# Patient Record
Sex: Female | Born: 1960
Health system: Southern US, Community
[De-identification: ages and names within clinical notes are randomized; demographics above are authoritative.]

---

## 1998-09-18 ENCOUNTER — Ambulatory Visit (HOSPITAL_BASED_OUTPATIENT_CLINIC_OR_DEPARTMENT_OTHER): Admission: RE | Admit: 1998-09-18 | Discharge: 1998-09-18 | Payer: Self-pay | Admitting: Otolaryngology

## 1999-04-02 ENCOUNTER — Other Ambulatory Visit: Admission: RE | Admit: 1999-04-02 | Discharge: 1999-04-02 | Payer: Self-pay | Admitting: Obstetrics and Gynecology

## 1999-07-19 ENCOUNTER — Inpatient Hospital Stay (HOSPITAL_COMMUNITY): Admission: RE | Admit: 1999-07-19 | Discharge: 1999-07-22 | Payer: Self-pay | Admitting: Obstetrics and Gynecology

## 2000-02-21 ENCOUNTER — Other Ambulatory Visit: Admission: RE | Admit: 2000-02-21 | Discharge: 2000-02-21 | Payer: Self-pay | Admitting: Obstetrics and Gynecology

## 2001-06-05 ENCOUNTER — Other Ambulatory Visit: Admission: RE | Admit: 2001-06-05 | Discharge: 2001-06-05 | Payer: Self-pay | Admitting: Obstetrics and Gynecology

## 2002-08-01 ENCOUNTER — Ambulatory Visit (HOSPITAL_COMMUNITY): Admission: RE | Admit: 2002-08-01 | Discharge: 2002-08-01 | Payer: Self-pay | Admitting: Neurosurgery

## 2002-08-01 ENCOUNTER — Encounter: Payer: Self-pay | Admitting: Neurosurgery

## 2003-08-13 ENCOUNTER — Other Ambulatory Visit: Admission: RE | Admit: 2003-08-13 | Discharge: 2003-08-13 | Payer: Self-pay | Admitting: Obstetrics and Gynecology

## 2005-10-25 ENCOUNTER — Encounter: Admission: RE | Admit: 2005-10-25 | Discharge: 2005-10-25 | Payer: Self-pay | Admitting: Family Medicine

## 2005-11-25 ENCOUNTER — Encounter: Admission: RE | Admit: 2005-11-25 | Discharge: 2005-11-25 | Payer: Self-pay | Admitting: Family Medicine

## 2011-11-14 ENCOUNTER — Other Ambulatory Visit: Payer: Self-pay | Admitting: Neurosurgery

## 2011-11-14 DIAGNOSIS — M5020 Other cervical disc displacement, unspecified cervical region: Secondary | ICD-10-CM

## 2011-11-15 ENCOUNTER — Ambulatory Visit
Admission: RE | Admit: 2011-11-15 | Discharge: 2011-11-15 | Disposition: A | Discharge status: Home / Self Care | Payer: Medicare Other | Source: Ambulatory Visit | Attending: Neurosurgery | Admitting: Neurosurgery

## 2011-11-15 ENCOUNTER — Inpatient Hospital Stay: Admission: RE | Admit: 2011-11-15 | Payer: Self-pay | Source: Ambulatory Visit

## 2011-11-15 DIAGNOSIS — M5020 Other cervical disc displacement, unspecified cervical region: Secondary | ICD-10-CM

## 2011-11-15 NOTE — Discharge Instructions (Signed)

## 2014-07-17 ENCOUNTER — Other Ambulatory Visit: Payer: Self-pay | Admitting: Family Medicine

## 2014-07-17 ENCOUNTER — Ambulatory Visit
Admission: RE | Admit: 2014-07-17 | Discharge: 2014-07-17 | Disposition: A | Discharge status: Home / Self Care | Payer: Medicare Other | Source: Ambulatory Visit | Attending: Family Medicine | Admitting: Family Medicine

## 2014-07-17 DIAGNOSIS — M542 Cervicalgia: Secondary | ICD-10-CM

## 2014-07-23 ENCOUNTER — Other Ambulatory Visit: Payer: Self-pay | Admitting: Family Medicine

## 2014-07-23 DIAGNOSIS — M542 Cervicalgia: Secondary | ICD-10-CM

## 2014-07-24 ENCOUNTER — Ambulatory Visit
Admission: RE | Admit: 2014-07-24 | Discharge: 2014-07-24 | Disposition: A | Discharge status: Home / Self Care | Payer: Medicare Other | Source: Ambulatory Visit | Attending: Family Medicine | Admitting: Family Medicine

## 2014-07-24 DIAGNOSIS — M542 Cervicalgia: Secondary | ICD-10-CM

## 2014-07-31 ENCOUNTER — Other Ambulatory Visit: Payer: Medicare Other

## 2014-08-18 DIAGNOSIS — H9190 Unspecified hearing loss, unspecified ear: Secondary | ICD-10-CM | POA: Insufficient documentation

## 2014-08-18 DIAGNOSIS — M5030 Other cervical disc degeneration, unspecified cervical region: Secondary | ICD-10-CM | POA: Insufficient documentation

## 2014-08-18 DIAGNOSIS — M4802 Spinal stenosis, cervical region: Secondary | ICD-10-CM | POA: Insufficient documentation

## 2014-08-18 DIAGNOSIS — E119 Type 2 diabetes mellitus without complications: Secondary | ICD-10-CM | POA: Insufficient documentation

## 2014-08-18 DIAGNOSIS — I10 Essential (primary) hypertension: Secondary | ICD-10-CM | POA: Insufficient documentation

## 2014-08-18 DIAGNOSIS — Z720 Tobacco use: Secondary | ICD-10-CM | POA: Insufficient documentation

## 2014-08-18 DIAGNOSIS — K589 Irritable bowel syndrome without diarrhea: Secondary | ICD-10-CM | POA: Insufficient documentation

## 2014-09-22 ENCOUNTER — Other Ambulatory Visit: Payer: Self-pay | Admitting: Neurosurgery

## 2014-09-22 DIAGNOSIS — M542 Cervicalgia: Secondary | ICD-10-CM

## 2014-09-30 ENCOUNTER — Ambulatory Visit
Admission: RE | Admit: 2014-09-30 | Discharge: 2014-09-30 | Disposition: A | Discharge status: Home / Self Care | Payer: Medicare Other | Source: Ambulatory Visit | Attending: Neurosurgery | Admitting: Neurosurgery

## 2014-09-30 VITALS — BP 146/90 | HR 71

## 2014-09-30 DIAGNOSIS — M5030 Other cervical disc degeneration, unspecified cervical region: Secondary | ICD-10-CM

## 2014-09-30 DIAGNOSIS — M542 Cervicalgia: Secondary | ICD-10-CM

## 2014-09-30 MED ORDER — IOHEXOL 300 MG/ML  SOLN
1.0000 mL | Freq: Once | INTRAMUSCULAR | Status: AC | PRN
  Administered 2014-09-30: 1 mL via EPIDURAL

## 2014-09-30 MED ORDER — TRIAMCINOLONE ACETONIDE 40 MG/ML IJ SUSP (RADIOLOGY)
60.0000 mg | Freq: Once | INTRAMUSCULAR | Status: AC
  Administered 2014-09-30: 60 mg via EPIDURAL

## 2014-09-30 NOTE — Discharge Instructions (Signed)

## 2014-12-04 ENCOUNTER — Other Ambulatory Visit: Payer: Self-pay | Admitting: Neurosurgery

## 2014-12-04 DIAGNOSIS — M47812 Spondylosis without myelopathy or radiculopathy, cervical region: Secondary | ICD-10-CM

## 2014-12-04 DIAGNOSIS — M545 Low back pain: Secondary | ICD-10-CM

## 2014-12-15 ENCOUNTER — Ambulatory Visit
Admission: RE | Admit: 2014-12-15 | Discharge: 2014-12-15 | Disposition: A | Discharge status: Home / Self Care | Payer: Medicare Other | Source: Ambulatory Visit | Attending: Neurosurgery | Admitting: Neurosurgery

## 2014-12-15 ENCOUNTER — Other Ambulatory Visit: Payer: Self-pay

## 2014-12-15 DIAGNOSIS — M4802 Spinal stenosis, cervical region: Secondary | ICD-10-CM

## 2014-12-15 DIAGNOSIS — M47812 Spondylosis without myelopathy or radiculopathy, cervical region: Secondary | ICD-10-CM

## 2014-12-15 MED ORDER — TRIAMCINOLONE ACETONIDE 40 MG/ML IJ SUSP (RADIOLOGY)
60.0000 mg | Freq: Once | INTRAMUSCULAR | Status: AC
  Administered 2014-12-15: 60 mg via EPIDURAL

## 2014-12-15 MED ORDER — IOHEXOL 300 MG/ML  SOLN
1.0000 mL | Freq: Once | INTRAMUSCULAR | Status: AC | PRN
  Administered 2014-12-15: 1 mL via INTRAVENOUS

## 2014-12-23 ENCOUNTER — Other Ambulatory Visit: Payer: Medicare Other

## 2015-09-07 DIAGNOSIS — R918 Other nonspecific abnormal finding of lung field: Secondary | ICD-10-CM | POA: Insufficient documentation

## 2015-12-05 DIAGNOSIS — F419 Anxiety disorder, unspecified: Secondary | ICD-10-CM | POA: Insufficient documentation

## 2015-12-24 ENCOUNTER — Ambulatory Visit
Admission: RE | Admit: 2015-12-24 | Discharge: 2015-12-24 | Disposition: A | Discharge status: Home / Self Care | Payer: Medicare Other | Source: Ambulatory Visit | Attending: Neurosurgery | Admitting: Neurosurgery

## 2015-12-24 ENCOUNTER — Other Ambulatory Visit: Payer: Self-pay | Admitting: Neurosurgery

## 2015-12-24 DIAGNOSIS — M47812 Spondylosis without myelopathy or radiculopathy, cervical region: Secondary | ICD-10-CM

## 2015-12-24 DIAGNOSIS — M545 Low back pain: Secondary | ICD-10-CM

## 2016-01-18 ENCOUNTER — Other Ambulatory Visit: Payer: Self-pay | Admitting: Neurosurgery

## 2016-01-18 DIAGNOSIS — M5090 Cervical disc disorder, unspecified, unspecified cervical region: Secondary | ICD-10-CM

## 2016-01-19 ENCOUNTER — Ambulatory Visit
Admission: RE | Admit: 2016-01-19 | Discharge: 2016-01-19 | Disposition: A | Discharge status: Home / Self Care | Payer: Medicare Other | Source: Ambulatory Visit | Attending: Neurosurgery | Admitting: Neurosurgery

## 2016-01-19 DIAGNOSIS — M5090 Cervical disc disorder, unspecified, unspecified cervical region: Secondary | ICD-10-CM

## 2016-01-29 ENCOUNTER — Other Ambulatory Visit: Payer: Self-pay | Admitting: Neurosurgery

## 2016-01-29 DIAGNOSIS — M5090 Cervical disc disorder, unspecified, unspecified cervical region: Secondary | ICD-10-CM

## 2016-02-03 ENCOUNTER — Ambulatory Visit
Admission: RE | Admit: 2016-02-03 | Discharge: 2016-02-03 | Disposition: A | Discharge status: Home / Self Care | Payer: Medicare Other | Source: Ambulatory Visit | Attending: Neurosurgery | Admitting: Neurosurgery

## 2016-02-03 DIAGNOSIS — R519 Headache, unspecified: Secondary | ICD-10-CM | POA: Insufficient documentation

## 2016-02-03 DIAGNOSIS — M5090 Cervical disc disorder, unspecified, unspecified cervical region: Secondary | ICD-10-CM

## 2016-02-03 DIAGNOSIS — R51 Headache: Secondary | ICD-10-CM

## 2016-02-03 MED ORDER — TRIAMCINOLONE ACETONIDE 40 MG/ML IJ SUSP (RADIOLOGY)
60.0000 mg | Freq: Once | INTRAMUSCULAR | Status: AC
  Administered 2016-02-03: 60 mg via EPIDURAL

## 2016-02-03 MED ORDER — IOPAMIDOL (ISOVUE-300) INJECTION 61%
1.0000 mL | Freq: Once | INTRAVENOUS | Status: AC | PRN
  Administered 2016-02-03: 1 mL

## 2016-02-03 NOTE — Discharge Instructions (Signed)

## 2016-07-04 ENCOUNTER — Encounter (INDEPENDENT_AMBULATORY_CARE_PROVIDER_SITE_OTHER): Payer: Medicare Other | Admitting: Ophthalmology

## 2016-10-17 ENCOUNTER — Other Ambulatory Visit: Payer: Self-pay | Admitting: Nurse Practitioner

## 2016-10-17 DIAGNOSIS — M5090 Cervical disc disorder, unspecified, unspecified cervical region: Secondary | ICD-10-CM

## 2016-11-08 ENCOUNTER — Ambulatory Visit
Admission: RE | Admit: 2016-11-08 | Discharge: 2016-11-08 | Disposition: A | Discharge status: Home / Self Care | Payer: Medicare Other | Source: Ambulatory Visit | Attending: Nurse Practitioner | Admitting: Nurse Practitioner

## 2016-11-08 DIAGNOSIS — M5090 Cervical disc disorder, unspecified, unspecified cervical region: Secondary | ICD-10-CM

## 2016-11-08 MED ORDER — TRIAMCINOLONE ACETONIDE 40 MG/ML IJ SUSP (RADIOLOGY)
60.0000 mg | Freq: Once | INTRAMUSCULAR | Status: AC
  Administered 2016-11-08: 60 mg via EPIDURAL

## 2016-11-08 MED ORDER — IOPAMIDOL (ISOVUE-M 300) INJECTION 61%
1.0000 mL | Freq: Once | INTRAMUSCULAR | Status: AC | PRN
  Administered 2016-11-08: 1 mL via EPIDURAL

## 2016-11-30 ENCOUNTER — Other Ambulatory Visit: Payer: Self-pay | Admitting: Nurse Practitioner

## 2016-11-30 DIAGNOSIS — M5090 Cervical disc disorder, unspecified, unspecified cervical region: Secondary | ICD-10-CM

## 2016-12-19 ENCOUNTER — Inpatient Hospital Stay
Admission: RE | Admit: 2016-12-19 | Discharge: 2016-12-19 | Disposition: A | Discharge status: Home / Self Care | Payer: Medicare Other | Source: Ambulatory Visit | Attending: Nurse Practitioner | Admitting: Nurse Practitioner

## 2016-12-26 ENCOUNTER — Ambulatory Visit
Admission: RE | Admit: 2016-12-26 | Discharge: 2016-12-26 | Disposition: A | Discharge status: Home / Self Care | Payer: Medicare Other | Source: Ambulatory Visit | Attending: Nurse Practitioner | Admitting: Nurse Practitioner

## 2016-12-26 DIAGNOSIS — M5090 Cervical disc disorder, unspecified, unspecified cervical region: Secondary | ICD-10-CM

## 2016-12-26 MED ORDER — IOPAMIDOL (ISOVUE-M 300) INJECTION 61%
15.0000 mL | Freq: Once | INTRAMUSCULAR | Status: AC | PRN
  Administered 2016-12-26: 15 mL via INTRATHECAL

## 2016-12-26 MED ORDER — TRIAMCINOLONE ACETONIDE 40 MG/ML IJ SUSP (RADIOLOGY)
60.0000 mg | Freq: Once | INTRAMUSCULAR | Status: AC
  Administered 2016-12-26: 60 mg via EPIDURAL

## 2016-12-26 NOTE — Discharge Instructions (Signed)

## 2017-06-23 ENCOUNTER — Other Ambulatory Visit: Payer: Self-pay | Admitting: Physician Assistant

## 2017-06-23 DIAGNOSIS — R609 Edema, unspecified: Secondary | ICD-10-CM

## 2017-06-26 ENCOUNTER — Other Ambulatory Visit: Payer: Medicare Other

## 2017-07-18 ENCOUNTER — Other Ambulatory Visit: Payer: Self-pay | Admitting: Physician Assistant

## 2017-07-18 ENCOUNTER — Ambulatory Visit
Admission: RE | Admit: 2017-07-18 | Discharge: 2017-07-18 | Disposition: A | Discharge status: Home / Self Care | Payer: Medicare Other | Source: Ambulatory Visit | Attending: Physician Assistant | Admitting: Physician Assistant

## 2017-07-18 DIAGNOSIS — R059 Cough, unspecified: Secondary | ICD-10-CM

## 2017-07-18 DIAGNOSIS — R05 Cough: Secondary | ICD-10-CM

## 2017-07-18 DIAGNOSIS — R0602 Shortness of breath: Secondary | ICD-10-CM

## 2017-07-18 MED ORDER — IOPAMIDOL (ISOVUE-370) INJECTION 76%
75.0000 mL | Freq: Once | INTRAVENOUS | Status: AC | PRN
  Administered 2017-07-18: 75 mL via INTRAVENOUS

## 2017-07-20 ENCOUNTER — Ambulatory Visit
Admission: RE | Admit: 2017-07-20 | Discharge: 2017-07-20 | Disposition: A | Discharge status: Home / Self Care | Payer: Medicare Other | Source: Ambulatory Visit | Attending: Physician Assistant | Admitting: Physician Assistant

## 2017-07-20 DIAGNOSIS — R609 Edema, unspecified: Secondary | ICD-10-CM

## 2018-02-01 ENCOUNTER — Other Ambulatory Visit: Payer: Self-pay | Admitting: Physician Assistant

## 2018-02-07 ENCOUNTER — Ambulatory Visit
Admission: RE | Admit: 2018-02-07 | Discharge: 2018-02-07 | Disposition: A | Discharge status: Home / Self Care | Payer: Medicare Other | Source: Ambulatory Visit | Attending: Nurse Practitioner | Admitting: Nurse Practitioner

## 2018-02-07 ENCOUNTER — Other Ambulatory Visit: Payer: Self-pay | Admitting: Nurse Practitioner

## 2018-02-07 DIAGNOSIS — M545 Low back pain, unspecified: Secondary | ICD-10-CM

## 2018-02-07 DIAGNOSIS — M542 Cervicalgia: Secondary | ICD-10-CM

## 2018-03-28 ENCOUNTER — Other Ambulatory Visit: Payer: Self-pay | Admitting: Neurosurgery

## 2018-03-28 DIAGNOSIS — M5090 Cervical disc disorder, unspecified, unspecified cervical region: Secondary | ICD-10-CM

## 2018-03-29 ENCOUNTER — Other Ambulatory Visit: Payer: Medicare Other

## 2018-03-31 ENCOUNTER — Inpatient Hospital Stay
Admission: RE | Admit: 2018-03-31 | Discharge: 2018-03-31 | Disposition: A | Discharge status: Home / Self Care | Payer: Medicare Other | Source: Ambulatory Visit | Attending: Neurosurgery | Admitting: Neurosurgery

## 2018-04-03 ENCOUNTER — Ambulatory Visit
Admission: RE | Admit: 2018-04-03 | Discharge: 2018-04-03 | Disposition: A | Discharge status: Home / Self Care | Payer: Medicare Other | Source: Ambulatory Visit | Attending: Neurosurgery | Admitting: Neurosurgery

## 2018-04-03 DIAGNOSIS — M5090 Cervical disc disorder, unspecified, unspecified cervical region: Secondary | ICD-10-CM

## 2018-04-04 ENCOUNTER — Other Ambulatory Visit: Payer: Medicare Other

## 2018-04-06 ENCOUNTER — Other Ambulatory Visit: Payer: Self-pay | Admitting: Neurosurgery

## 2018-04-06 DIAGNOSIS — M5090 Cervical disc disorder, unspecified, unspecified cervical region: Secondary | ICD-10-CM

## 2018-04-18 ENCOUNTER — Ambulatory Visit
Admission: RE | Admit: 2018-04-18 | Discharge: 2018-04-18 | Disposition: A | Discharge status: Home / Self Care | Payer: Medicare Other | Source: Ambulatory Visit | Attending: Neurosurgery | Admitting: Neurosurgery

## 2018-04-18 DIAGNOSIS — M5090 Cervical disc disorder, unspecified, unspecified cervical region: Secondary | ICD-10-CM

## 2018-04-18 MED ORDER — TRIAMCINOLONE ACETONIDE 40 MG/ML IJ SUSP (RADIOLOGY)
60.0000 mg | Freq: Once | INTRAMUSCULAR | Status: AC
  Administered 2018-04-18: 60 mg via EPIDURAL

## 2018-04-18 MED ORDER — IOPAMIDOL (ISOVUE-M 200) INJECTION 41%
1.0000 mL | Freq: Once | INTRAMUSCULAR | Status: AC
  Administered 2018-04-18: 1 mL via EPIDURAL

## 2018-04-18 NOTE — Discharge Instructions (Signed)

## 2018-09-10 ENCOUNTER — Other Ambulatory Visit: Payer: Self-pay | Admitting: Nurse Practitioner

## 2018-09-10 ENCOUNTER — Ambulatory Visit
Admission: RE | Admit: 2018-09-10 | Discharge: 2018-09-10 | Disposition: A | Discharge status: Home / Self Care | Payer: Medicare Other | Source: Ambulatory Visit | Attending: Nurse Practitioner | Admitting: Nurse Practitioner

## 2018-09-10 DIAGNOSIS — M545 Low back pain, unspecified: Secondary | ICD-10-CM

## 2018-09-10 DIAGNOSIS — G8929 Other chronic pain: Secondary | ICD-10-CM

## 2018-09-16 IMAGING — DX DG RIBS 2V*L*
3 series · 3 of 3 positions shown · non-contrast
Comparison: None.

CLINICAL DATA: LEFT anterior rib pain.  For 3 weeks.

EXAM:
LEFT RIBS - 2 VIEW

[dg ribs unilateral left (1 of 3)]
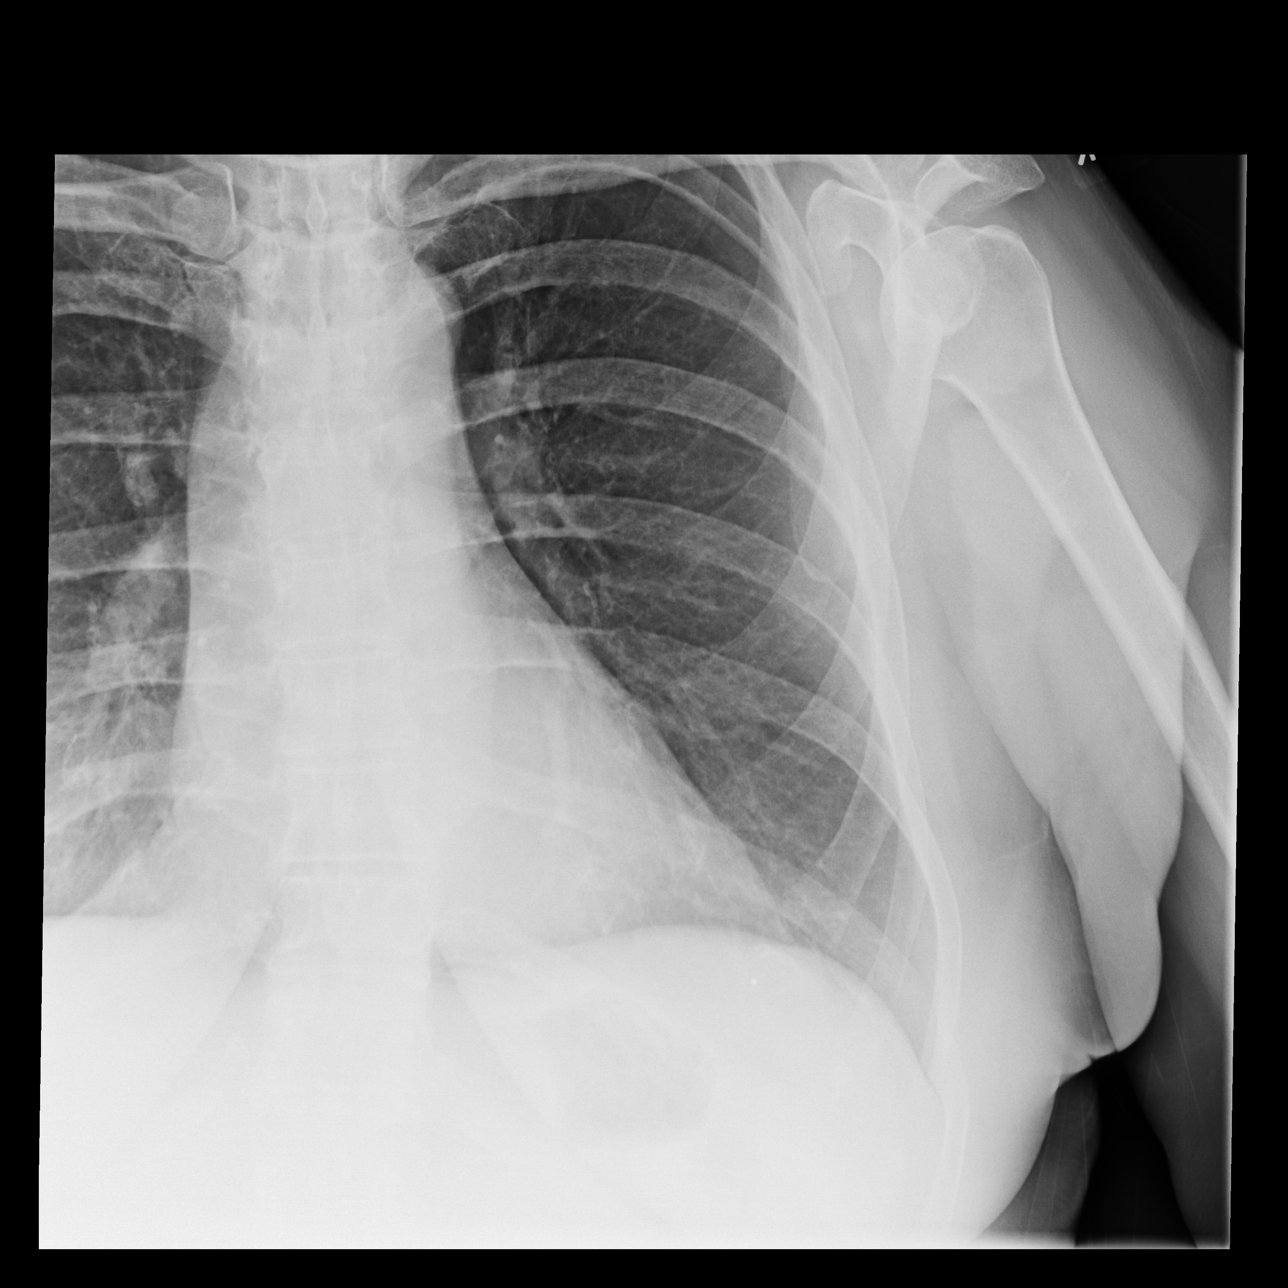

[dg ribs unilateral left (2 of 3)]
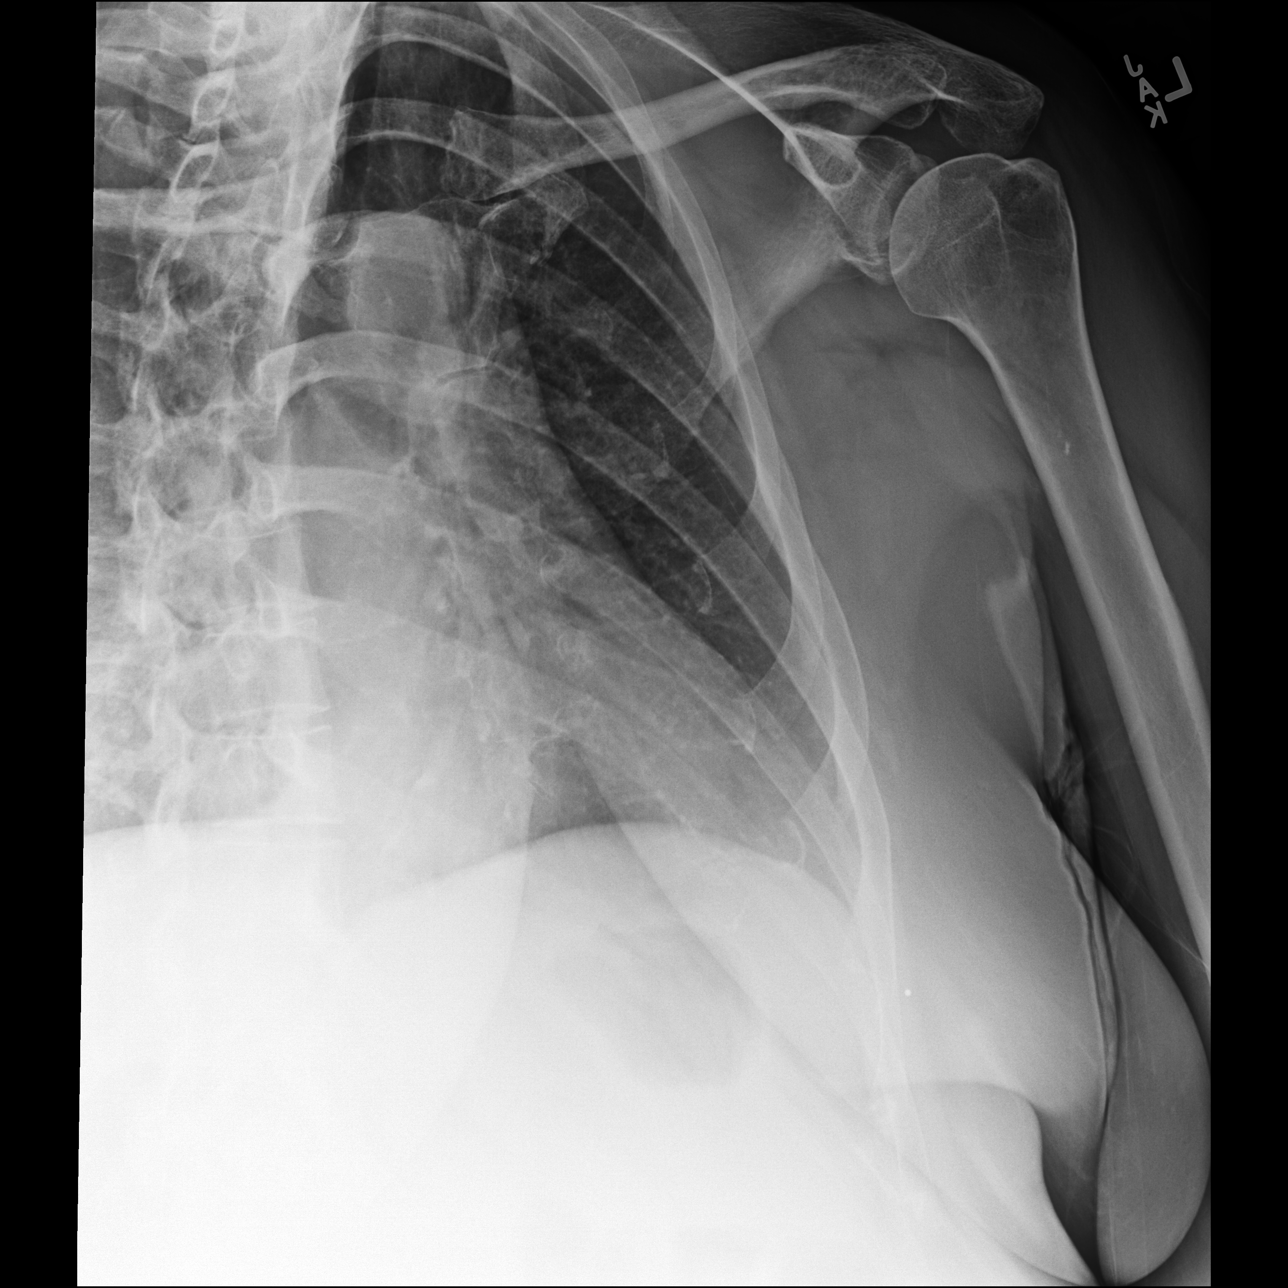

[dg ribs unilateral left (3 of 3)]
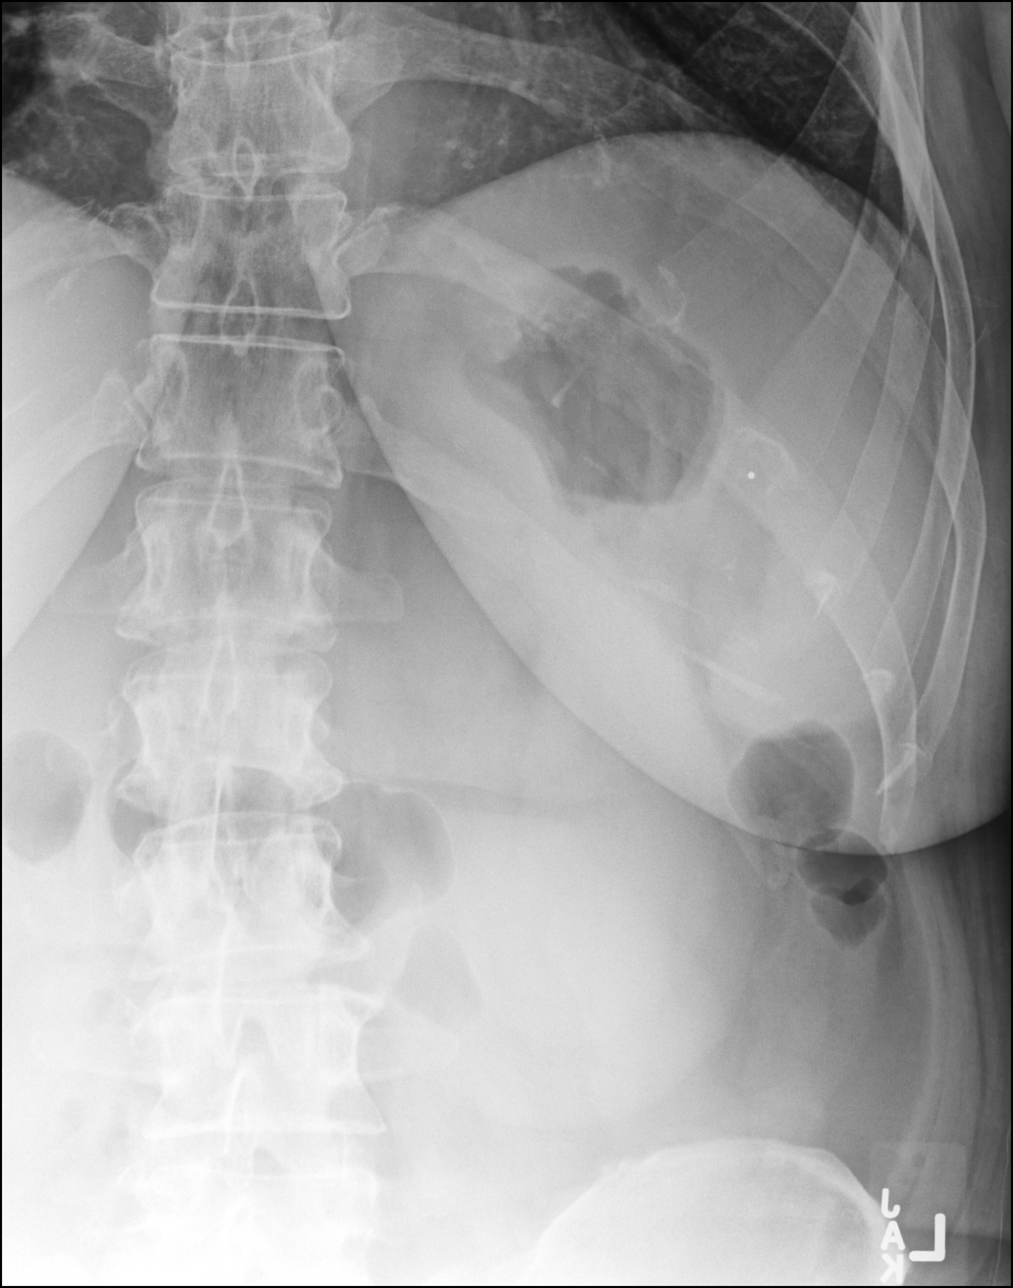

[3 of 3 positions shown; findings below may reference images not displayed]

FINDINGS: No fracture or other bone lesions are seen involving the ribs.
IMPRESSION: Negative.

## 2019-04-08 IMAGING — CR DG CERVICAL SPINE 2 OR 3 VIEWS
2 series · 2 of 2 positions shown · non-contrast
Comparison: 01/19/2016.

CLINICAL DATA: Low back pain.

EXAM:
CERVICAL SPINE - 2-3 VIEW

[w cervical spine lat]
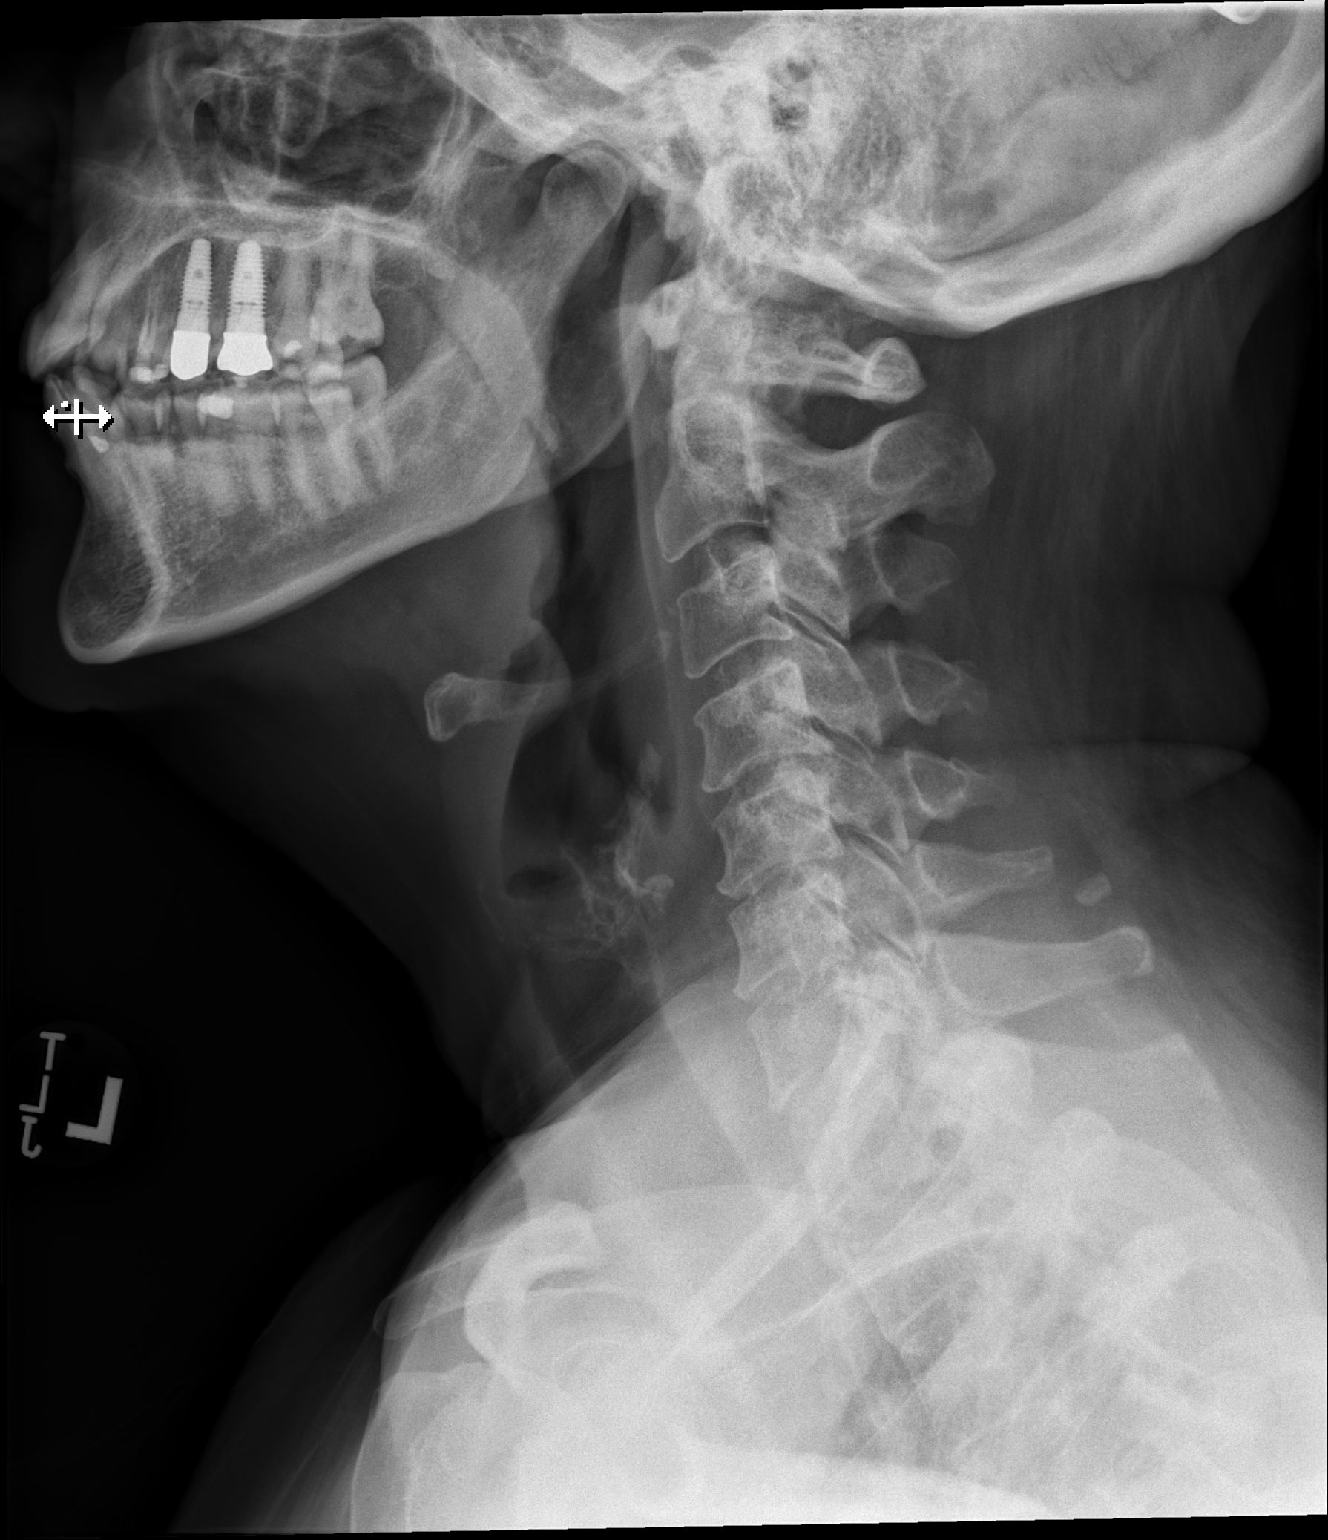

[w cervical spine ap]
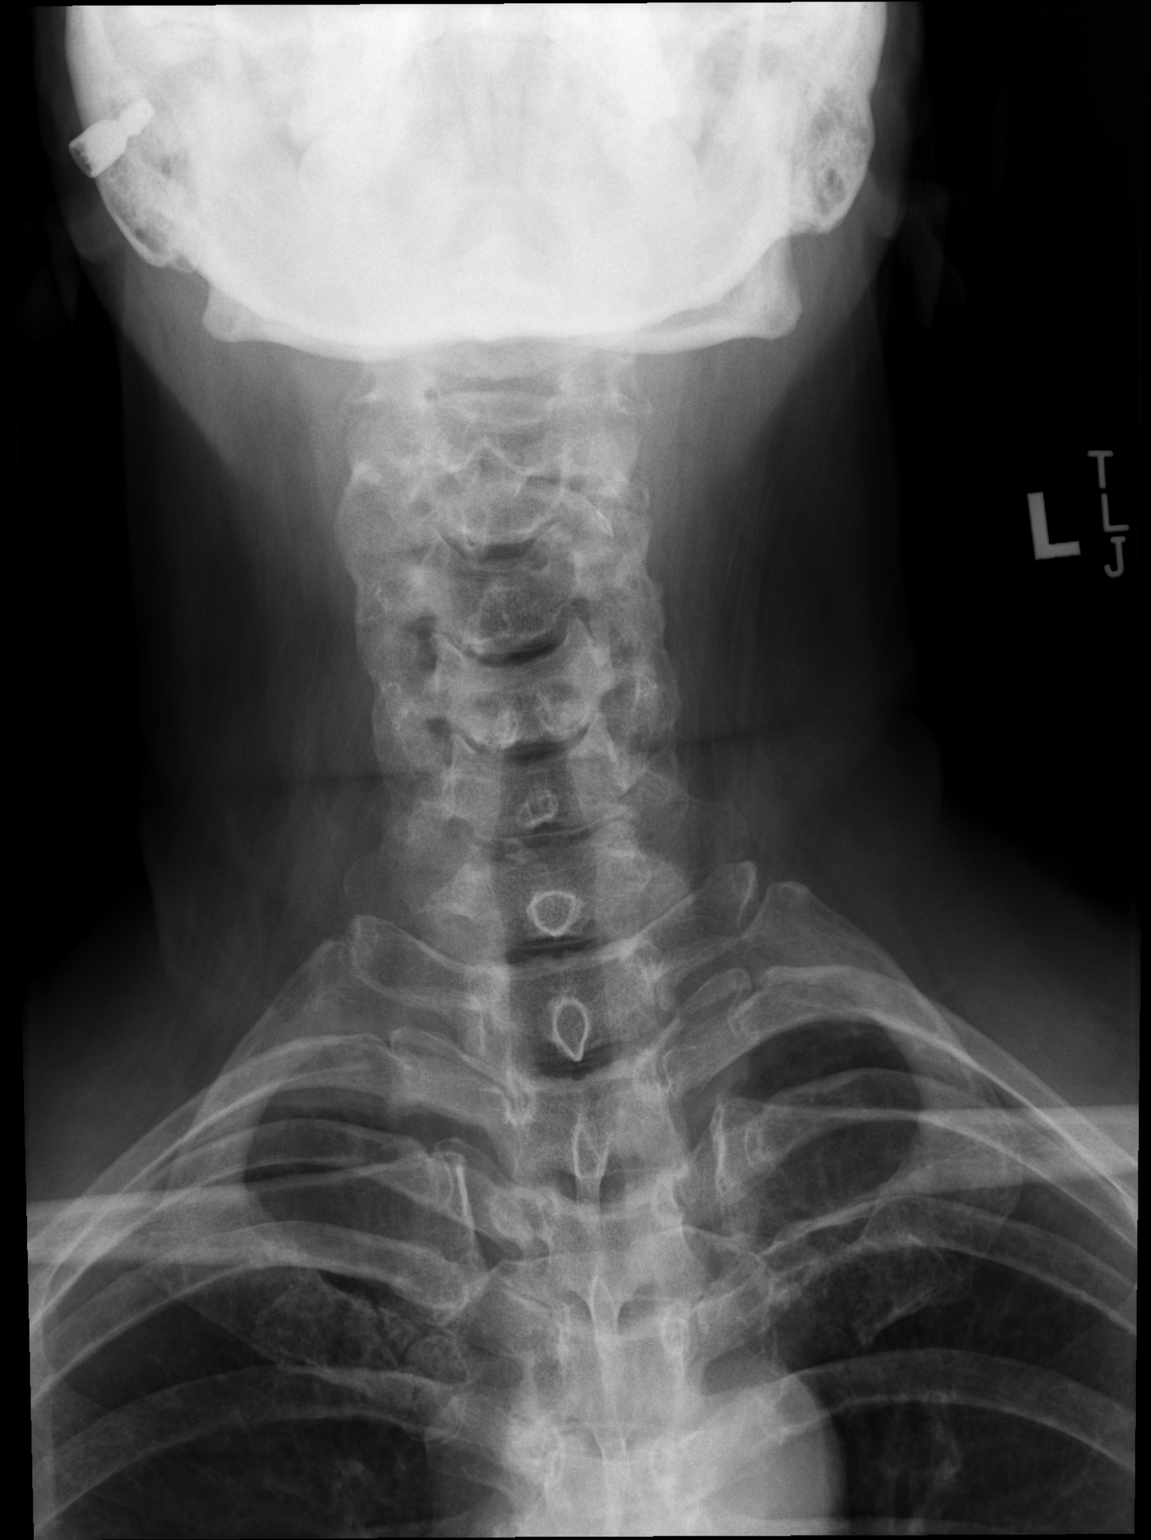

[2 of 2 positions shown; findings below may reference images not displayed]

FINDINGS: Diffuse multilevel prominent degenerative change. No acute bony
abnormality identified. No evidence of fracture or dislocation.
Ligamentous ossification. Biapical pleural thickening consistent
with scarring.
IMPRESSION: Diffuse prominent multilevel degenerative change.No acute
abnormality.

## 2019-05-10 IMAGING — US US EXTREM LOW VENOUS*R*
1 series · 13 of 24 positions shown · non-contrast
Comparison: None.

CLINICAL DATA: Right lower extremity edema. History of skin cancer
and smoking. Evaluate for DVT.



[Series 1: us extrem low venous*right* · 0.07mm/px · 13 of 34 slices shown]
[im 1/34]
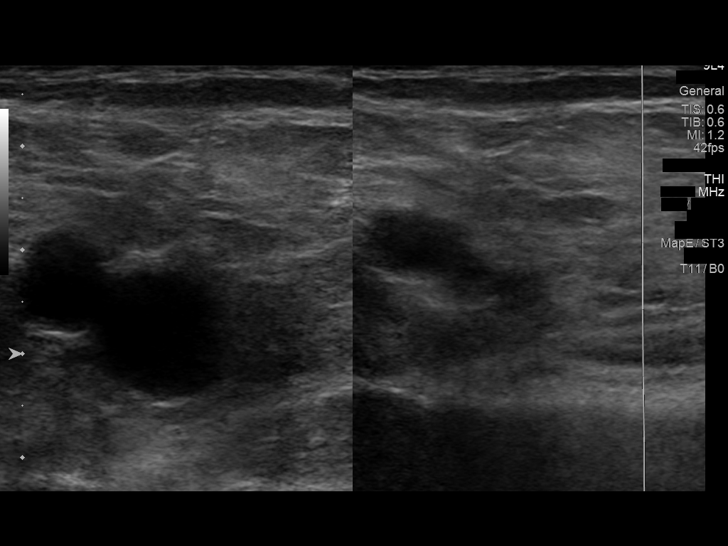
[im 3/34]
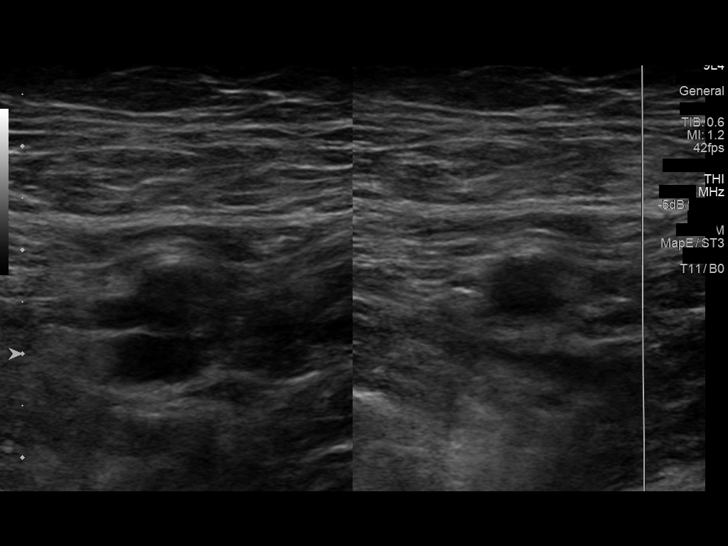
[im 6/34]
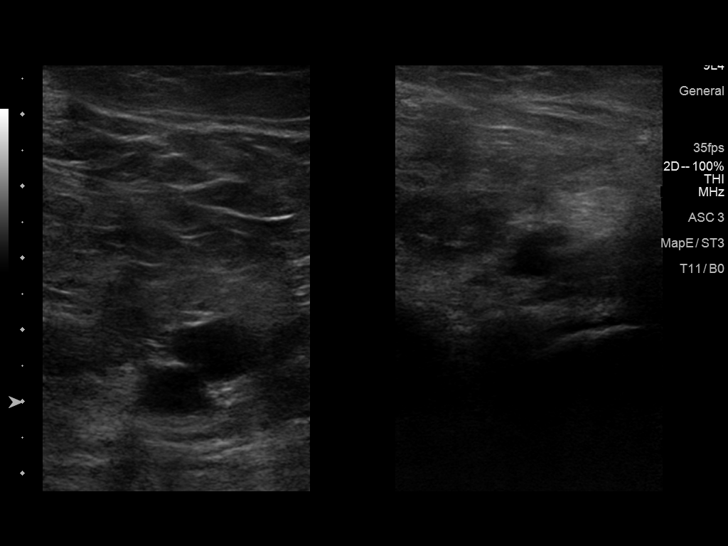
[im 9/34]
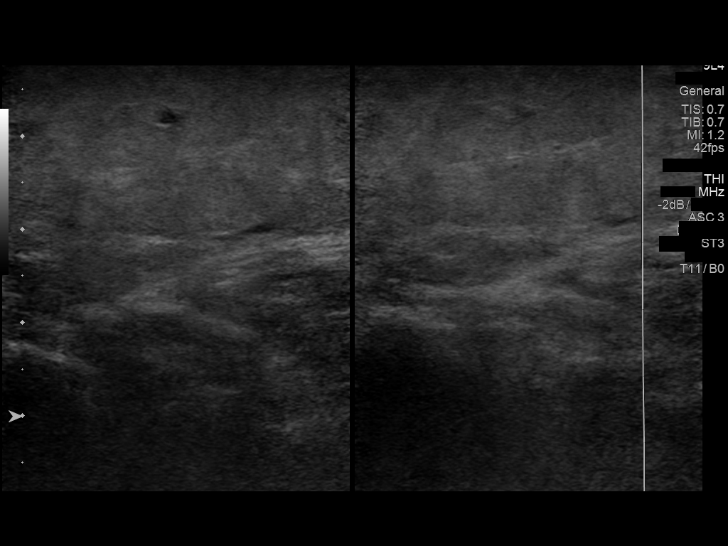
[im 12/34]
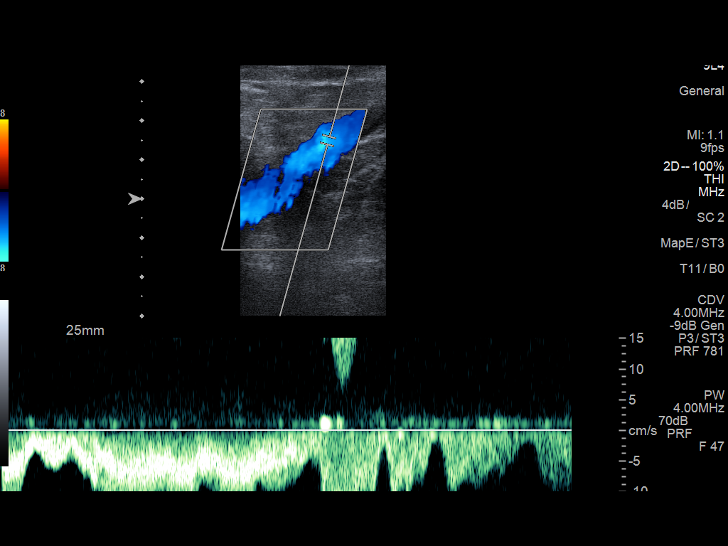
[im 15/34]
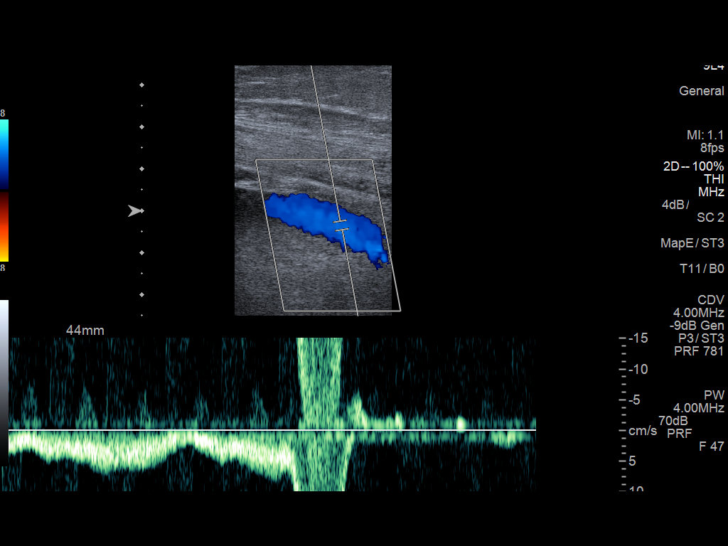
[im 18/34]
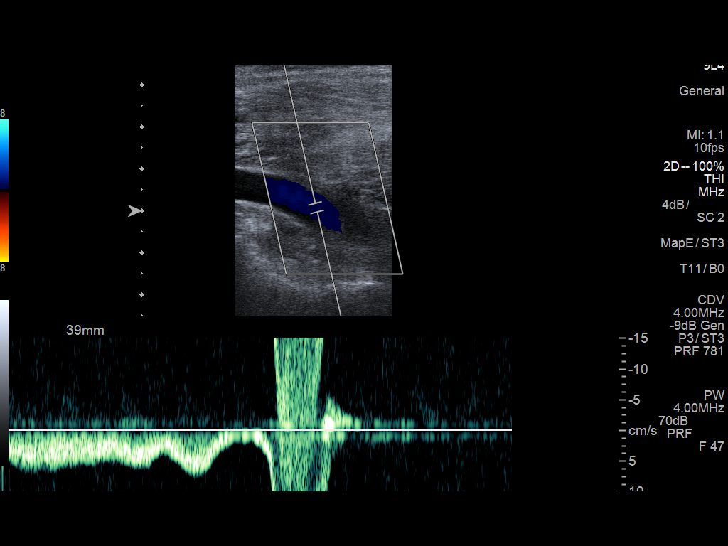
[im 19/34]
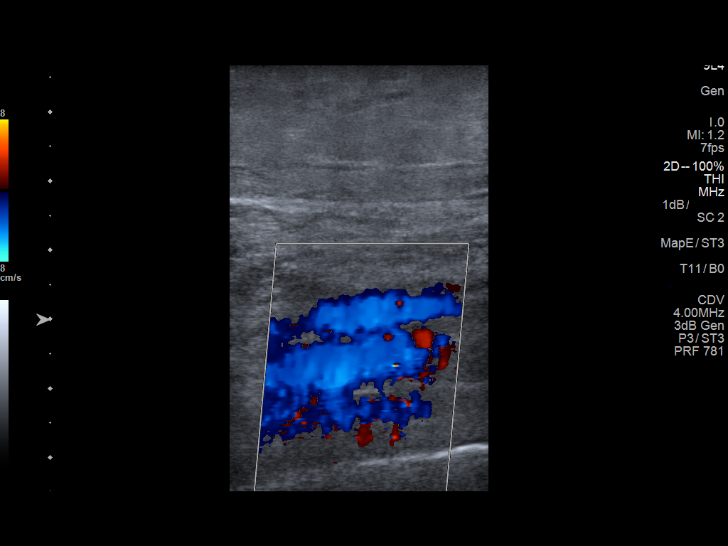
[im 22/34]
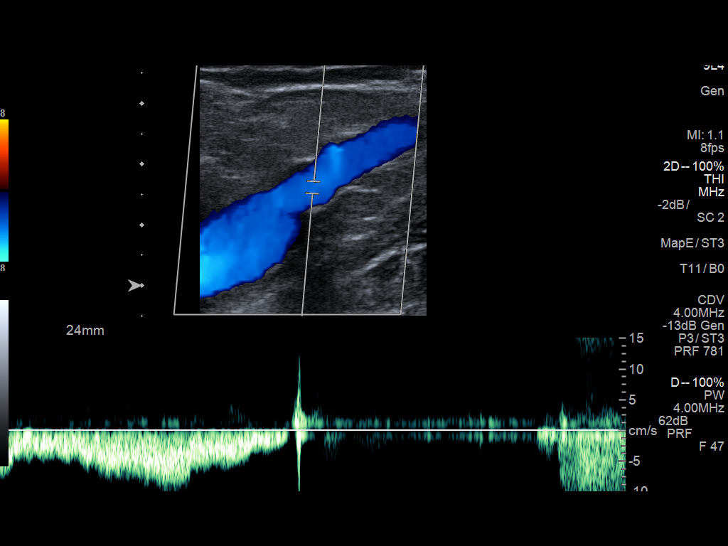
[im 25/34]
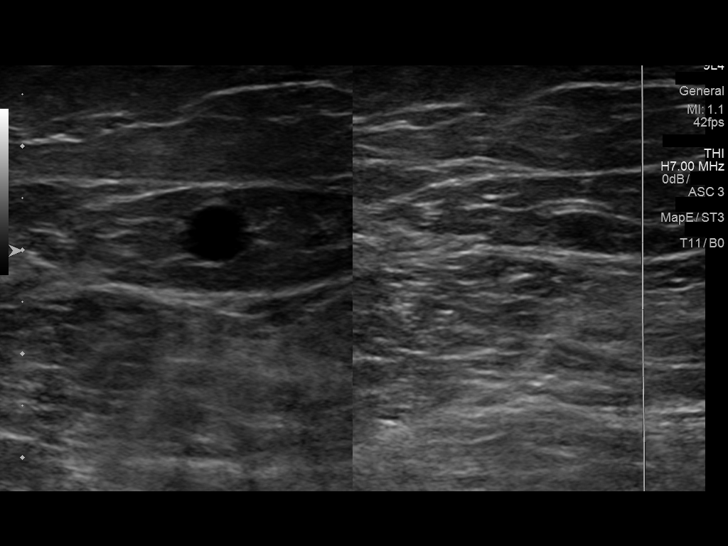
[im 28/34]
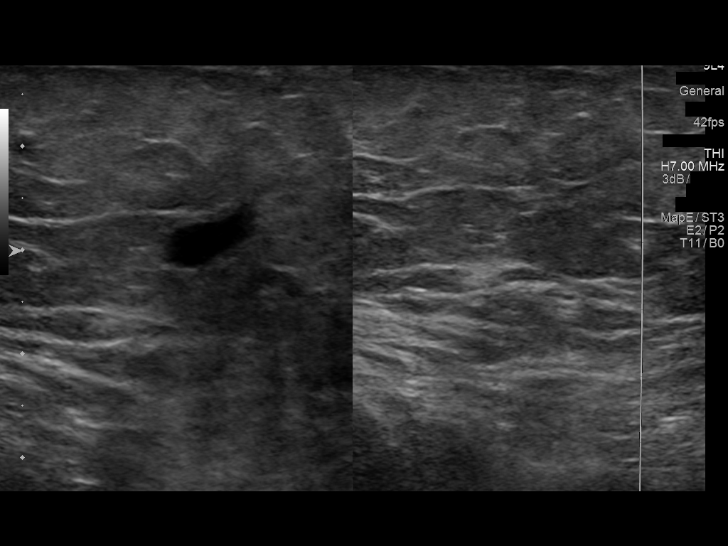
[im 31/34]
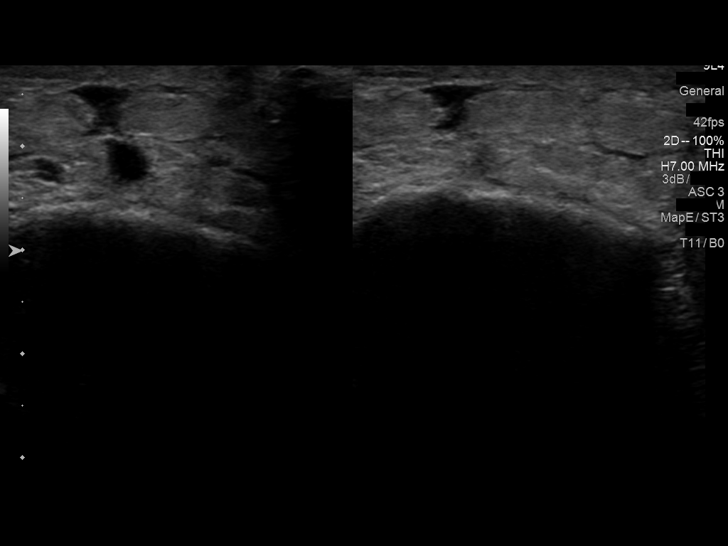
[im 34/34]
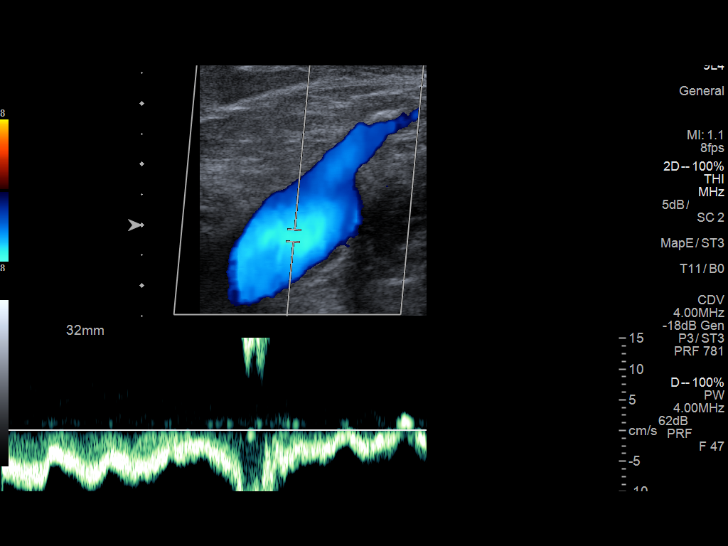

[13 of 24 positions shown; findings below may reference images not displayed]

FINDINGS: Contralateral Common Femoral Vein: Respiratory phasicity is normal
and symmetric with the symptomatic side. No evidence of thrombus.
Normal compressibility.

Common Femoral Vein: No evidence of thrombus. Normal
compressibility, respiratory phasicity and response to augmentation.

Saphenofemoral Junction: No evidence of thrombus. Normal
compressibility and flow on color Doppler imaging.

Profunda Femoral Vein: No evidence of thrombus. Normal
compressibility and flow on color Doppler imaging.

Femoral Vein: No evidence of thrombus. Normal compressibility,
respiratory phasicity and response to augmentation.

Popliteal Vein: No evidence of thrombus. Normal compressibility,
respiratory phasicity and response to augmentation.

Calf Veins: No evidence of thrombus. Normal compressibility and flow
on color Doppler imaging.

Superficial Great Saphenous Vein: No evidence of thrombus. Normal
compressibility.

Venous Reflux:  None.

Other Findings:  None.
IMPRESSION: No evidence of DVT within the right lower extremity.

## 2020-04-29 ENCOUNTER — Ambulatory Visit
Admission: RE | Admit: 2020-04-29 | Discharge: 2020-04-29 | Disposition: A | Discharge status: Home / Self Care | Payer: Medicare Other | Source: Ambulatory Visit | Attending: Family Medicine | Admitting: Family Medicine

## 2020-04-29 ENCOUNTER — Other Ambulatory Visit: Payer: Self-pay

## 2020-04-29 ENCOUNTER — Other Ambulatory Visit: Payer: Self-pay | Admitting: Family Medicine

## 2020-04-29 DIAGNOSIS — W19XXXA Unspecified fall, initial encounter: Secondary | ICD-10-CM

## 2020-12-28 ENCOUNTER — Other Ambulatory Visit: Payer: Self-pay | Admitting: Neurosurgery

## 2020-12-28 DIAGNOSIS — M5136 Other intervertebral disc degeneration, lumbar region: Secondary | ICD-10-CM

## 2020-12-31 ENCOUNTER — Ambulatory Visit
Admission: RE | Admit: 2020-12-31 | Discharge: 2020-12-31 | Disposition: A | Discharge status: Home / Self Care | Payer: Medicare Other | Source: Ambulatory Visit | Attending: Neurosurgery | Admitting: Neurosurgery

## 2020-12-31 DIAGNOSIS — M5136 Other intervertebral disc degeneration, lumbar region: Secondary | ICD-10-CM

## 2021-01-19 ENCOUNTER — Other Ambulatory Visit: Payer: Self-pay | Admitting: Neurosurgery

## 2021-01-19 DIAGNOSIS — M47816 Spondylosis without myelopathy or radiculopathy, lumbar region: Secondary | ICD-10-CM

## 2021-01-25 ENCOUNTER — Inpatient Hospital Stay
Admission: RE | Admit: 2021-01-25 | Discharge: 2021-01-25 | Disposition: A | Discharge status: Home / Self Care | Payer: Medicare Other | Source: Ambulatory Visit | Attending: Neurosurgery | Admitting: Neurosurgery

## 2021-01-25 ENCOUNTER — Other Ambulatory Visit: Payer: Medicare Other

## 2021-01-25 NOTE — Discharge Instructions (Signed)

## 2021-01-26 ENCOUNTER — Other Ambulatory Visit: Payer: Self-pay | Admitting: Neurosurgery

## 2021-01-26 DIAGNOSIS — S32020A Wedge compression fracture of second lumbar vertebra, initial encounter for closed fracture: Secondary | ICD-10-CM

## 2021-02-19 ENCOUNTER — Ambulatory Visit
Admission: RE | Admit: 2021-02-19 | Discharge: 2021-02-19 | Disposition: A | Discharge status: Home / Self Care | Payer: Medicare Other | Source: Ambulatory Visit | Attending: Neurosurgery | Admitting: Neurosurgery

## 2021-02-19 ENCOUNTER — Other Ambulatory Visit: Payer: Self-pay

## 2021-02-19 DIAGNOSIS — S32020A Wedge compression fracture of second lumbar vertebra, initial encounter for closed fracture: Secondary | ICD-10-CM

## 2021-10-20 ENCOUNTER — Other Ambulatory Visit: Payer: Self-pay | Admitting: Neurological Surgery

## 2021-10-20 DIAGNOSIS — S32020A Wedge compression fracture of second lumbar vertebra, initial encounter for closed fracture: Secondary | ICD-10-CM

## 2021-11-01 ENCOUNTER — Ambulatory Visit
Admission: RE | Admit: 2021-11-01 | Discharge: 2021-11-01 | Disposition: A | Discharge status: Home / Self Care | Payer: Medicare Other | Source: Ambulatory Visit | Attending: Neurological Surgery | Admitting: Neurological Surgery

## 2021-11-01 ENCOUNTER — Other Ambulatory Visit: Payer: Self-pay

## 2021-11-01 DIAGNOSIS — S32020A Wedge compression fracture of second lumbar vertebra, initial encounter for closed fracture: Secondary | ICD-10-CM

## 2022-11-16 ENCOUNTER — Encounter (INDEPENDENT_AMBULATORY_CARE_PROVIDER_SITE_OTHER): Payer: Medicare Other | Admitting: Ophthalmology

## 2022-12-06 ENCOUNTER — Encounter (INDEPENDENT_AMBULATORY_CARE_PROVIDER_SITE_OTHER): Payer: Medicare Other | Admitting: Ophthalmology

## 2022-12-06 DIAGNOSIS — H43813 Vitreous degeneration, bilateral: Secondary | ICD-10-CM

## 2022-12-06 DIAGNOSIS — H35033 Hypertensive retinopathy, bilateral: Secondary | ICD-10-CM | POA: Diagnosis not present

## 2022-12-06 DIAGNOSIS — I10 Essential (primary) hypertension: Secondary | ICD-10-CM
